# Patient Record
Sex: Male | Born: 1995 | Race: White | Hispanic: No | Marital: Single
Health system: Southern US, Community
[De-identification: ages and names within clinical notes are randomized; demographics above are authoritative.]

## PROBLEM LIST (undated history)

## (undated) DIAGNOSIS — Q231 Congenital insufficiency of aortic valve: Secondary | ICD-10-CM

---

## 2018-11-11 ENCOUNTER — Encounter: Payer: Self-pay | Admitting: Emergency Medicine

## 2018-11-11 ENCOUNTER — Emergency Department
Admission: EM | Admit: 2018-11-11 | Discharge: 2018-11-11 | Disposition: A | Discharge status: Home / Self Care | Payer: Medicaid Other | Attending: Emergency Medicine | Admitting: Emergency Medicine

## 2018-11-11 ENCOUNTER — Other Ambulatory Visit: Payer: Self-pay

## 2018-11-11 ENCOUNTER — Emergency Department: Payer: Medicaid Other

## 2018-11-11 DIAGNOSIS — Y9372 Activity, wrestling: Secondary | ICD-10-CM | POA: Insufficient documentation

## 2018-11-11 DIAGNOSIS — Y999 Unspecified external cause status: Secondary | ICD-10-CM | POA: Insufficient documentation

## 2018-11-11 DIAGNOSIS — X500XXA Overexertion from strenuous movement or load, initial encounter: Secondary | ICD-10-CM | POA: Insufficient documentation

## 2018-11-11 DIAGNOSIS — Y929 Unspecified place or not applicable: Secondary | ICD-10-CM | POA: Insufficient documentation

## 2018-11-11 DIAGNOSIS — S93401A Sprain of unspecified ligament of right ankle, initial encounter: Secondary | ICD-10-CM | POA: Diagnosis not present

## 2018-11-11 DIAGNOSIS — S99911A Unspecified injury of right ankle, initial encounter: Secondary | ICD-10-CM | POA: Diagnosis present

## 2018-11-11 HISTORY — DX: Congenital insufficiency of aortic valve: Q23.1

## 2018-11-11 NOTE — Discharge Instructions (Addendum)
Your exam and

## 2018-11-11 NOTE — ED Provider Notes (Signed)
[facility] Emergency Department Provider Note ____________________________________________  Time seen: 1605  I have reviewed the triage vital signs and the nursing notes.  HISTORY  Chief Complaint  Ankle Pain  HPI Brad Williams is a 23 y.o. male presents himself to the ED for evaluation of persistent right ankle pain and disability.  Patient reports mechanical injury about 2 and half weeks prior.  He describes he was wrestling, when he sustained a hyper extension injury to the right ankle.  Since that time he said swelling, bruising, pain and disability to the right ankle.  He has been taking anti-inflammatories and applying ice intermittently.  He presents today due to persistent pain to the medial ankle at the Achilles and the lateral malleolus.  He reports swelling is significantly improved and bruising is nearly resolved.   Past Medical History:  Diagnosis Date  . Bicuspid aortic valve     There are no active problems to display for this patient.   History reviewed. No pertinent surgical history.  Prior to Admission medications   Not on File    Allergies Patient has no allergy information on record.  No family history on file.  Social History Social History   Tobacco Use  . Smoking status: Not on file  Substance Use Topics  . Alcohol use: Not on file  . Drug use: Not on file    Review of Systems  Constitutional: Negative for fever. Cardiovascular: Negative for chest pain. Respiratory: Negative for shortness of breath. Musculoskeletal: Negative for back pain.  Right ankle pain as above. Skin: Negative for rash. Neurological: Negative for headaches, focal weakness or numbness. ____________________________________________  PHYSICAL EXAM:  VITAL SIGNS: ED Triage Vitals  Enc Vitals Group     BP 11/11/18 1606 138/85     Pulse Rate 11/11/18 1606 73     Resp 11/11/18 1606 16     Temp 11/11/18 1606 98.5 F (36.9 C)     Temp Source  11/11/18 1606 Oral     SpO2 11/11/18 1606 97 %     Weight 11/11/18 1557 140 lb (63.5 kg)     Height 11/11/18 1557 5\' 2"  (1.575 m)     Head Circumference --      Peak Flow --      Pain Score 11/11/18 1556 3     Pain Loc --      Pain Edu? --      Excl. in GC? --     Constitutional: Alert and oriented. Well appearing and in no distress. Head: Normocephalic and atraumatic. Eyes: Conjunctivae are normal. Normal extraocular movements Cardiovascular: Normal rate, regular rhythm. Normal distal pulses. Respiratory: Normal respiratory effort. No wheezes/rales/rhonchi. Musculoskeletal: Right ankle without obvious deformity or dislocation.  Patient is noted to have some subtle lateral soft tissue swelling at the malleolus.  He is also mildly tender to palpation at the medial Achilles fossa.  No specific Achilles or calf tenderness is appreciated.  Normal ankle range of motion on exam.  Negative anterior/posterior drawer sign on exam.  Exam is otherwise benign.  Nontender with normal range of motion in all extremities.  Neurologic:  Normal gait without ataxia. Normal speech and language. No gross focal neurologic deficits are appreciated. Skin:  Skin is warm, dry and intact. No rash noted. ____________________________________________   RADIOLOGY  DG Right Ankle  negative  I, Bettyanne Dittman V Bacon-Makahla Kiser, personally viewed and evaluated these images (plain radiographs) as part of my medical decision making, as well as reviewing the written  report by the radiologist. ____________________________________________  PROCEDURES  Velcro stirrup splint Procedures ____________________________________________  INITIAL IMPRESSION / ASSESSMENT AND PLAN / ED COURSE  Patient with ED evaluation of a 2-1/2-week complaint of persistent right ankle pain and disability after mechanical injury.  His exam is overall benign and x-ray is negative for any acute fracture or dislocation.  Clinical picture is consistent  with a probable grade 2 ankle sprain on the right.  Patient is placed in a stirrup splint for support, and is encouraged to continue taking over-the-counter or prescription anti-inflammatories as prescribed.  He is referred to orthopedics for any ongoing symptom management.  He is released to activities as tolerated.  Brad Williams was evaluated in Emergency Department on 11/11/2018 for the symptoms described in the history of present illness. He was evaluated in the context of the global COVID-19 pandemic, which necessitated consideration that the patient might be at risk for infection with the SARS-CoV-2 virus that causes COVID-19. Institutional protocols and algorithms that pertain to the evaluation of patients at risk for COVID-19 are in a state of rapid change based on information released by regulatory bodies including the CDC and federal and state organizations. These policies and algorithms were followed during the patient's care in the ED. ____________________________________________  FINAL CLINICAL IMPRESSION(S) / ED DIAGNOSES  Final diagnoses:  Sprain of right ankle, unspecified ligament, initial encounter      Melvenia Needles, PA-C 11/11/18 1706    Lavonia Drafts, MD 11/11/18 [phone]

## 2018-11-11 NOTE — ED Notes (Signed)
Pt with c/o right ankle pain with pivoting. Injured ankle about 2 weeks ago, still in pain. Minor swelling noted to right ankle.

## 2018-11-11 NOTE — ED Triage Notes (Signed)
Pt reports right ankle pain from mechanical injury 2 weeks prior.

## 2021-05-03 IMAGING — DX DG ANKLE COMPLETE 3+V*R*
3 series · 3 of 3 positions shown · non-contrast
Comparison: None.

CLINICAL DATA: Right ankle pain after injury 2 weeks ago.

EXAM:
RIGHT ANKLE - COMPLETE 3+ VIEW

[ankle ap]
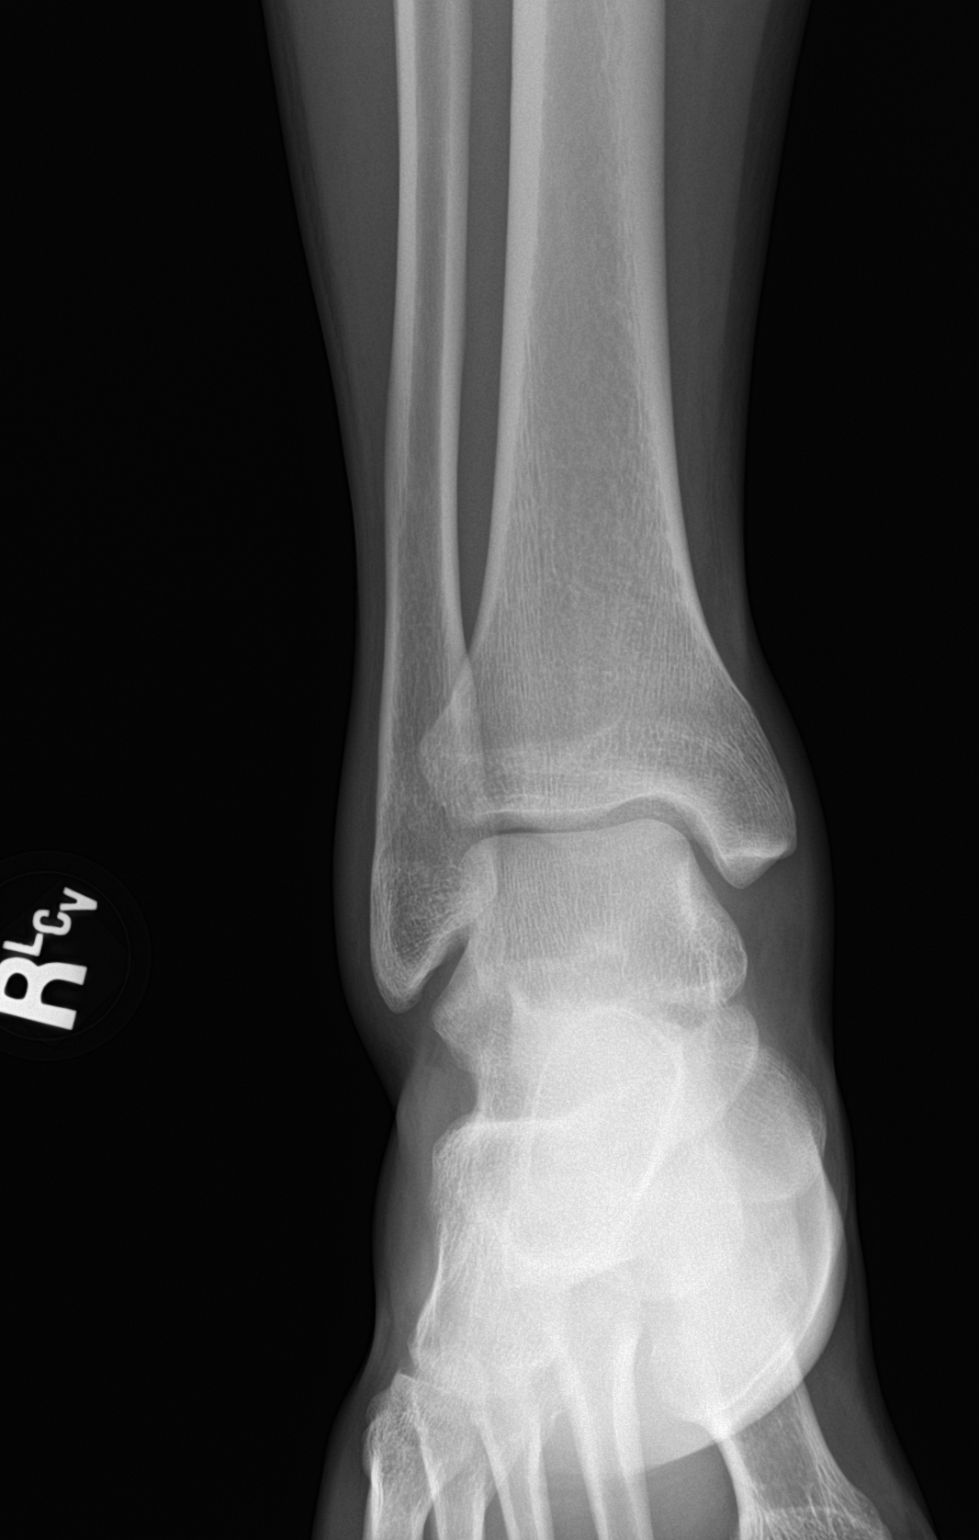

[ankle obl]
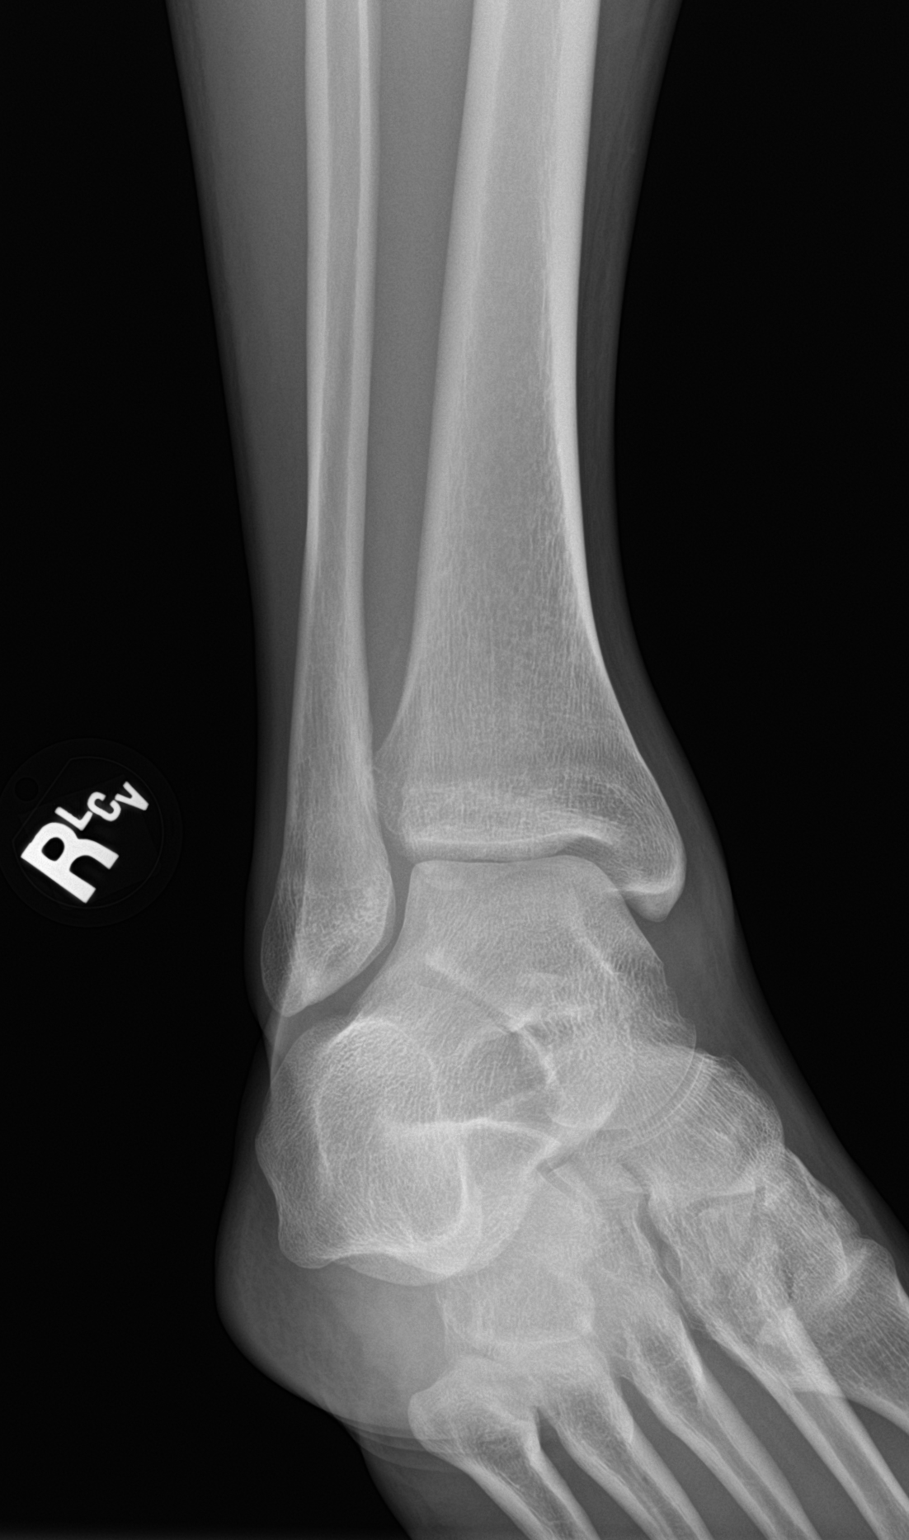

[ankle lat]
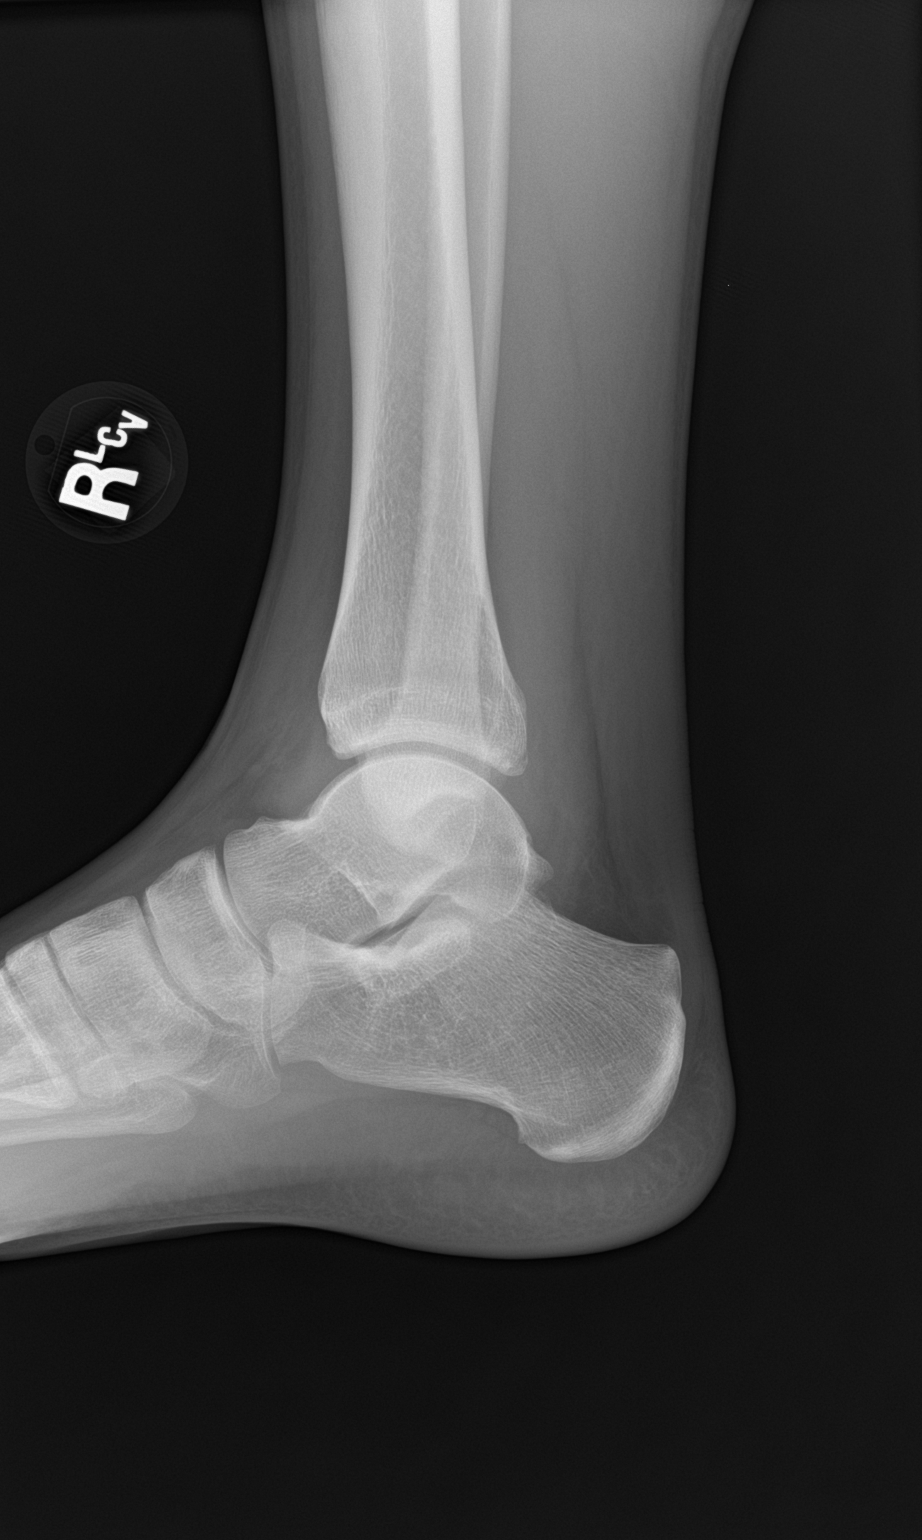

[3 of 3 positions shown; findings below may reference images not displayed]

FINDINGS: There is no evidence of fracture, dislocation, or joint effusion.
There is no evidence of arthropathy or other focal bone abnormality.
Soft tissues are unremarkable.
IMPRESSION: No acute findings.
